# Patient Record
Sex: Male | Born: 2000 | Race: White | Hispanic: No | Marital: Single | Smoking: Never smoker
Health system: Southern US, Community
[De-identification: ages and names within clinical notes are randomized; demographics above are authoritative.]

## PROBLEM LIST (undated history)

## (undated) DIAGNOSIS — F909 Attention-deficit hyperactivity disorder, unspecified type: Secondary | ICD-10-CM

## (undated) HISTORY — DX: Attention-deficit hyperactivity disorder, unspecified type: F90.9

---
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
## 2019-10-03 ENCOUNTER — Ambulatory Visit: Payer: Self-pay

## 2019-11-22 ENCOUNTER — Ambulatory Visit: Payer: Self-pay | Attending: Internal Medicine

## 2019-11-22 DIAGNOSIS — Z23 Encounter for immunization: Secondary | ICD-10-CM

## 2019-11-22 NOTE — Progress Notes (Signed)
   Covid-19 Vaccination Clinic  Name:  [name]    MRN: [id number] DOB: Jun 23, 2001  11/22/2019  [name] was observed post Covid-19 immunization for 15 minutes without incident. He was provided with Vaccine Information Sheet and instruction to access the V-Safe system.   [name] was instructed to call 911 with any severe reactions post vaccine: Marland Kitchen Difficulty breathing  . Swelling of face and throat  . A fast heartbeat  . A bad rash all over body  . Dizziness and weakness   Immunizations Administered    Name Date Dose VIS Date Route   Pfizer COVID-19 Vaccine 11/22/2019 11:57 AM 0.3 mL 09/03/2018 Intramuscular   Manufacturer: [employer], [employer]   Lot: [id number]   NDC: [address]-3

## 2019-12-15 ENCOUNTER — Ambulatory Visit: Payer: Self-pay | Attending: Internal Medicine

## 2019-12-15 DIAGNOSIS — Z23 Encounter for immunization: Secondary | ICD-10-CM

## 2019-12-15 NOTE — Progress Notes (Signed)
° °  Covid-19 Vaccination Clinic  Name:  Benjamin Pollard    MRN: [id number] DOB: 2001/07/09  12/15/2019  [name] was observed post Covid-19 immunization for 15 minutes without incident. He was provided with Vaccine Information Sheet and instruction to access the V-Safe system.   [name] was instructed to call 911 with any severe reactions post vaccine:  Difficulty breathing   Swelling of face and throat   A fast heartbeat   A bad rash all over body   Dizziness and weakness   Immunizations Administered    Name Date Dose VIS Date Route   Pfizer COVID-19 Vaccine 12/15/2019  3:35 PM 0.3 mL 09/03/2018 Intramuscular   Manufacturer: [employer], [employer]   Lot: [id number]   NDC: [address]-9
